# Patient Record
Sex: Female | Born: 2000 | Race: Black or African American | Hispanic: No | Smoking: Never smoker
Health system: Southern US, Community
[De-identification: ages and names within clinical notes are randomized; demographics above are authoritative.]

## PROBLEM LIST (undated history)

## (undated) HISTORY — PX: UMBILICAL HERNIA REPAIR: SHX196

---
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
## 2021-05-20 ENCOUNTER — Emergency Department: Payer: BC Managed Care – PPO

## 2021-05-20 ENCOUNTER — Emergency Department
Admission: EM | Admit: 2021-05-20 | Discharge: 2021-05-20 | Disposition: A | Discharge status: Home / Self Care | Payer: BC Managed Care – PPO | Attending: Emergency Medicine | Admitting: Emergency Medicine

## 2021-05-20 ENCOUNTER — Other Ambulatory Visit: Payer: Self-pay

## 2021-05-20 DIAGNOSIS — N390 Urinary tract infection, site not specified: Secondary | ICD-10-CM | POA: Insufficient documentation

## 2021-05-20 DIAGNOSIS — R102 Pelvic and perineal pain: Secondary | ICD-10-CM | POA: Diagnosis not present

## 2021-05-20 DIAGNOSIS — R1031 Right lower quadrant pain: Secondary | ICD-10-CM | POA: Diagnosis present

## 2021-05-20 LAB — COMPREHENSIVE METABOLIC PANEL
ALT: 13 U/L (ref 0–44)
AST: 19 U/L (ref 15–41)
Albumin: 3.8 g/dL (ref 3.5–5.0)
Alkaline Phosphatase: 38 U/L (ref 38–126)
Anion gap: 9 (ref 5–15)
BUN: 10 mg/dL (ref 6–20)
CO2: 24 mmol/L (ref 22–32)
Calcium: 8.9 mg/dL (ref 8.9–10.3)
Chloride: 105 mmol/L (ref 98–111)
Creatinine, Ser: 0.93 mg/dL (ref 0.44–1.00)
GFR, Estimated: >60 mL/min (ref >60)
Glucose, Bld: 91 mg/dL (ref 70–99)
Potassium: 3.6 mmol/L (ref 3.5–5.1)
Sodium: 138 mmol/L (ref 135–145)
Total Bilirubin: 1.2 mg/dL (ref 0.3–1.2)
Total Protein: 7.5 g/dL (ref 6.5–8.1)

## 2021-05-20 LAB — URINALYSIS, COMPLETE (UACMP) WITH MICROSCOPIC
Bilirubin Urine: NEGATIVE
Glucose, UA: NEGATIVE mg/dL
Ketones, ur: NEGATIVE mg/dL
Nitrite: NEGATIVE
Protein, ur: NEGATIVE mg/dL
Specific Gravity, Urine: 1.016 (ref 1.005–1.030)
pH: 5 (ref 5.0–8.0)

## 2021-05-20 LAB — CBC
HCT: 39.8 % (ref 36.0–46.0)
Hemoglobin: 13.2 g/dL (ref 12.0–15.0)
MCH: 28.1 pg (ref 26.0–34.0)
MCHC: 33.2 g/dL (ref 30.0–36.0)
MCV: 84.9 fL (ref 80.0–100.0)
Platelets: 226 10*3/uL (ref 150–400)
RBC: 4.69 MIL/uL (ref 3.87–5.11)
RDW: 13.2 % (ref 11.5–15.5)
WBC: 10.1 10*3/uL (ref 4.0–10.5)
nRBC: 0 % (ref 0.0–0.2)

## 2021-05-20 LAB — HCG, QUANTITATIVE, PREGNANCY: hCG, Beta Chain, Quant, S: <1 m[IU]/mL (ref <5)

## 2021-05-20 LAB — LIPASE, BLOOD: Lipase: 33 U/L (ref 11–51)

## 2021-05-20 MED ORDER — CEPHALEXIN 500 MG PO CAPS: 500.0000 mg | ORAL_CAPSULE | Freq: Two times a day (BID) | ORAL | 0 refills | Status: DC

## 2021-05-20 NOTE — ED Notes (Addendum)
Lab called to add on hcg to previous labwork

## 2021-05-20 NOTE — ED Triage Notes (Signed)
Pt here with right side and pain that does not radiate. Pt states that pain moves from right to left at times. Pt denies N/V/D. Pt states normal BM but a little gassy.

## 2021-05-20 NOTE — ED Notes (Signed)
RN assisted pt to the bathroom to provide urine sample at this time. Pt unable to void. MD made aware

## 2021-05-20 NOTE — ED Provider Notes (Signed)
[facility] Emergency Department Provider Note   ____________________________________________    I have reviewed the triage vital signs and the nursing notes.   HISTORY  Chief Complaint Abdominal Pain     HPI Debbie Tanner is a 20 y.o. female who presents with complaints of right lower quadrant abdominal pain.  Patient reports the pain started yesterday morning and was moderate to severe and constant yesterday, however today it is more intermittent and significantly improved.  She attributed to premenstrual cramping however it is atypical for her.  She has not take anything for this.  No vaginal bleeding.  She reports she is not sexually active.  No concern for STD, no vaginal discharge.  No flank pain or back pain.  No nausea or vomiting  No past medical history on file.  There are no problems to display for this patient.   No past surgical history on file.  Prior to Admission medications   Medication Sig Start Date End Date Taking? Authorizing Provider  cephALEXin (KEFLEX) 500 MG capsule Take 1 capsule (500 mg total) by mouth 2 (two) times daily. 05/20/21  Yes Jene Every, MD     Allergies Patient has no known allergies.  No family history on file.  Social History Social History   Tobacco Use   Smoking status: Never   Smokeless tobacco: Never  Substance Use Topics   Alcohol use: No    Alcohol/week: 0.0 standard drinks   Drug use: No    Review of Systems  Constitutional: No fever/chills Eyes: No visual changes.  ENT: No sore throat. Cardiovascular: Denies chest pain. Respiratory: Denies shortness of breath. Gastrointestinal: As above Genitourinary: As above Musculoskeletal: Negative for back pain. Skin: Negative for rash. Neurological: Negative for headaches or weakness   ____________________________________________   PHYSICAL EXAM:  VITAL SIGNS: ED Triage Vitals  Enc Vitals Group     BP 05/20/21 0647 137/81      Pulse Rate 05/20/21 0647 92     Resp 05/20/21 0647 16     Temp 05/20/21 0647 98.7 F (37.1 C)     Temp Source 05/20/21 0647 Oral     SpO2 05/20/21 0647 100 %     Weight 05/20/21 0645 65.3 kg (144 lb)     Height 05/20/21 0645 1.6 m (5\' 3" )     Head Circumference --      Peak Flow --      Pain Score 05/20/21 0714 8     Pain Loc --      Pain Edu? --      Excl. in GC? --     Constitutional: Alert and oriented.  Eyes: Conjunctivae are normal.   Nose: No congestion/rhinnorhea. Mouth/Throat: Mucous membranes are moist.    Cardiovascular: Normal rate, regular rhythm. Good peripheral circulation. Respiratory: Normal respiratory effort.  No retractions.  Gastrointestinal: Soft, minimal right lower quadrant tenderness, no distention.  No CVA tenderness. Genitourinary: deferred Musculoskeletal: No lower extremity tenderness nor edema.  Warm and well perfused Neurologic:  Normal speech and language. No gross focal neurologic deficits are appreciated.  Skin:  Skin is warm, dry and intact. No rash noted. Psychiatric: Mood and affect are normal. Speech and behavior are normal.  ____________________________________________   LABS (all labs ordered are listed, but only abnormal results are displayed)  Labs Reviewed  URINALYSIS, COMPLETE (UACMP) WITH MICROSCOPIC - Abnormal; Notable for the following components:      Result Value   Color, Urine YELLOW (*)  APPearance CLOUDY (*)    Hgb urine dipstick SMALL (*)    Leukocytes,Ua LARGE (*)    Bacteria, UA FEW (*)    All other components within normal limits  LIPASE, BLOOD  COMPREHENSIVE METABOLIC PANEL  CBC  HCG, QUANTITATIVE, PREGNANCY   ____________________________________________  EKG   ____________________________________________  RADIOLOGY  Ultrasound reviewed by me, no abnormality ____________________________________________   PROCEDURES  Procedure(s) performed: No  Procedures   Critical Care performed:  No ____________________________________________   INITIAL IMPRESSION / ASSESSMENT AND PLAN / ED COURSE  Pertinent labs & imaging results that were available during my care of the patient were reviewed by me and considered in my medical decision making (see chart for details).   Patient presents with right lower quadrant pain as described above, significantly improved from yesterday.  Question ovarian cyst, possibly ruptured.  Less likely torsion, normal white blood cell count, exam not consistent with appendicitis.  Doubt ureterolithiasis.  UTI is a possibility.  Pending urine, urine pregnancy, will obtain ultrasound  Pregnancy test negative, ultrasound is normal.  Urinalysis consistent with urinary tract infection, no flank pain to suggest ureterolithiasis or Pilo.  We will start the patient on antibiotics, return precautions if no improvement within 48 hours.    ____________________________________________   FINAL CLINICAL IMPRESSION(S) / ED DIAGNOSES  Final diagnoses:  Pelvic pain  Lower urinary tract infectious disease        Note:  This document was prepared using Dragon voice recognition software and may include unintentional dictation errors.    Jene Every, MD 05/20/21 1257

## 2021-06-01 ENCOUNTER — Encounter
Admission: EM | Disposition: A | Discharge status: Home / Self Care | Payer: Self-pay | Source: Home / Self Care | Attending: Emergency Medicine

## 2021-06-01 ENCOUNTER — Emergency Department: Payer: BC Managed Care – PPO

## 2021-06-01 ENCOUNTER — Observation Stay: Payer: BC Managed Care – PPO | Admitting: Certified Registered Nurse Anesthetist

## 2021-06-01 ENCOUNTER — Other Ambulatory Visit: Payer: Self-pay

## 2021-06-01 ENCOUNTER — Observation Stay
Admission: EM | Admit: 2021-06-01 | Discharge: 2021-06-02 | Disposition: A | Discharge status: Home / Self Care | Payer: BC Managed Care – PPO | Attending: Surgery | Admitting: Surgery

## 2021-06-01 ENCOUNTER — Encounter: Payer: Self-pay | Admitting: Emergency Medicine

## 2021-06-01 DIAGNOSIS — K3580 Unspecified acute appendicitis: Secondary | ICD-10-CM | POA: Diagnosis present

## 2021-06-01 DIAGNOSIS — Z20822 Contact with and (suspected) exposure to covid-19: Secondary | ICD-10-CM | POA: Insufficient documentation

## 2021-06-01 DIAGNOSIS — K353 Acute appendicitis with localized peritonitis, without perforation or gangrene: Secondary | ICD-10-CM | POA: Diagnosis not present

## 2021-06-01 DIAGNOSIS — R1030 Lower abdominal pain, unspecified: Secondary | ICD-10-CM | POA: Diagnosis present

## 2021-06-01 HISTORY — PX: LAPAROSCOPIC APPENDECTOMY: SHX408

## 2021-06-01 LAB — CBC
HCT: 38.6 % (ref 36.0–46.0)
Hemoglobin: 13.1 g/dL (ref 12.0–15.0)
MCH: 28.4 pg (ref 26.0–34.0)
MCHC: 33.9 g/dL (ref 30.0–36.0)
MCV: 83.7 fL (ref 80.0–100.0)
Platelets: 333 10*3/uL (ref 150–400)
RBC: 4.61 MIL/uL (ref 3.87–5.11)
RDW: 12.4 % (ref 11.5–15.5)
WBC: 11.7 10*3/uL — ABNORMAL HIGH (ref 4.0–10.5)
nRBC: 0 % (ref 0.0–0.2)

## 2021-06-01 LAB — POC URINE PREG, ED: Preg Test, Ur: NEGATIVE

## 2021-06-01 LAB — COMPREHENSIVE METABOLIC PANEL
ALT: 12 U/L (ref 0–44)
AST: 19 U/L (ref 15–41)
Albumin: 4.2 g/dL (ref 3.5–5.0)
Alkaline Phosphatase: 48 U/L (ref 38–126)
Anion gap: 7 (ref 5–15)
BUN: 6 mg/dL (ref 6–20)
CO2: 26 mmol/L (ref 22–32)
Calcium: 9.3 mg/dL (ref 8.9–10.3)
Chloride: 103 mmol/L (ref 98–111)
Creatinine, Ser: 0.95 mg/dL (ref 0.44–1.00)
GFR, Estimated: >60 mL/min (ref >60)
Glucose, Bld: 111 mg/dL — ABNORMAL HIGH (ref 70–99)
Potassium: 3.3 mmol/L — ABNORMAL LOW (ref 3.5–5.1)
Sodium: 136 mmol/L (ref 135–145)
Total Bilirubin: 0.8 mg/dL (ref 0.3–1.2)
Total Protein: 8.4 g/dL — ABNORMAL HIGH (ref 6.5–8.1)

## 2021-06-01 LAB — URINALYSIS, COMPLETE (UACMP) WITH MICROSCOPIC
Bacteria, UA: NONE SEEN
Bilirubin Urine: NEGATIVE
Glucose, UA: NEGATIVE mg/dL
Hgb urine dipstick: NEGATIVE
Ketones, ur: 5 mg/dL — AB
Leukocytes,Ua: NEGATIVE
Nitrite: NEGATIVE
Protein, ur: NEGATIVE mg/dL
Specific Gravity, Urine: 1.019 (ref 1.005–1.030)
pH: 6 (ref 5.0–8.0)

## 2021-06-01 LAB — RESP PANEL BY RT-PCR (FLU A&B, COVID) ARPGX2
Influenza A by PCR: NEGATIVE
Influenza B by PCR: NEGATIVE
SARS Coronavirus 2 by RT PCR: NEGATIVE

## 2021-06-01 LAB — LIPASE, BLOOD: Lipase: 29 U/L (ref 11–51)

## 2021-06-01 SURGERY — APPENDECTOMY, LAPAROSCOPIC
Anesthesia: General

## 2021-06-01 MED ORDER — LACTATED RINGERS IV SOLN: 125.0000 mL/h | INTRAVENOUS | Status: DC

## 2021-06-01 MED ORDER — PROPOFOL 10 MG/ML IV BOLUS
INTRAVENOUS | Status: DC | PRN
Start: 2021-06-01 — End: 2021-06-01
  Administered 2021-06-01: 150 mg via INTRAVENOUS

## 2021-06-01 MED ORDER — FENTANYL CITRATE (PF) 100 MCG/2ML IJ SOLN
INTRAMUSCULAR | Status: DC | PRN
  Administered 2021-06-01: 100 ug via INTRAVENOUS

## 2021-06-01 MED ORDER — LACTATED RINGERS IV SOLN: INTRAVENOUS | Status: DC

## 2021-06-01 MED ORDER — ONDANSETRON HCL 4 MG/2ML IJ SOLN
4.0000 mg | Freq: Four times a day (QID) | INTRAMUSCULAR | Status: DC | PRN
  Administered 2021-06-01: 4 mg via INTRAVENOUS

## 2021-06-01 MED ORDER — PROPOFOL 10 MG/ML IV BOLUS
INTRAVENOUS | Status: AC
  Filled 2021-06-01: qty 20

## 2021-06-01 MED ORDER — PIPERACILLIN-TAZOBACTAM 3.375 G IVPB
3.3750 g | Freq: Three times a day (TID) | INTRAVENOUS | Status: DC
  Administered 2021-06-01 – 2021-06-02 (×5): 3.375 g via INTRAVENOUS
  Filled 2021-06-01 (×7): qty 50

## 2021-06-01 MED ORDER — OXYCODONE HCL 5 MG PO TABS: 5.0000 mg | ORAL_TABLET | ORAL | Status: DC | PRN

## 2021-06-01 MED ORDER — 0.9 % SODIUM CHLORIDE (POUR BTL) OPTIME
TOPICAL | Status: DC | PRN
  Administered 2021-06-01: 500 mL

## 2021-06-01 MED ORDER — ROCURONIUM BROMIDE 100 MG/10ML IV SOLN
INTRAVENOUS | Status: DC | PRN
  Administered 2021-06-01: 500 mg via INTRAVENOUS

## 2021-06-01 MED ORDER — LACTATED RINGERS IV BOLUS
1000.0000 mL | Freq: Once | INTRAVENOUS | Status: AC
  Administered 2021-06-01: 1000 mL via INTRAVENOUS

## 2021-06-01 MED ORDER — OXYCODONE HCL 5 MG PO TABS: 5.0000 mg | ORAL_TABLET | Freq: Once | ORAL | Status: DC | PRN

## 2021-06-01 MED ORDER — PANTOPRAZOLE SODIUM 40 MG IV SOLR
40.0000 mg | Freq: Every day | INTRAVENOUS | Status: DC
  Administered 2021-06-01: 40 mg via INTRAVENOUS
  Filled 2021-06-01 (×2): qty 40

## 2021-06-01 MED ORDER — ONDANSETRON 4 MG PO TBDP: 4.0000 mg | ORAL_TABLET | Freq: Four times a day (QID) | ORAL | Status: DC | PRN

## 2021-06-01 MED ORDER — MORPHINE SULFATE (PF) 4 MG/ML IV SOLN
4.0000 mg | Freq: Once | INTRAVENOUS | Status: AC
  Administered 2021-06-01: 4 mg via INTRAVENOUS
  Filled 2021-06-01: qty 1

## 2021-06-01 MED ORDER — NEOSTIGMINE METHYLSULFATE 10 MG/10ML IV SOLN
INTRAVENOUS | Status: AC
  Filled 2021-06-01: qty 1

## 2021-06-01 MED ORDER — ACETAMINOPHEN 10 MG/ML IV SOLN: 1000.0000 mg | Freq: Once | INTRAVENOUS | Status: DC | PRN

## 2021-06-01 MED ORDER — FENTANYL CITRATE (PF) 100 MCG/2ML IJ SOLN
INTRAMUSCULAR | Status: AC
  Filled 2021-06-01: qty 2

## 2021-06-01 MED ORDER — PIPERACILLIN-TAZOBACTAM 3.375 G IVPB
INTRAVENOUS | Status: AC
  Filled 2021-06-01: qty 50

## 2021-06-01 MED ORDER — ACETAMINOPHEN 10 MG/ML IV SOLN
INTRAVENOUS | Status: DC | PRN
  Administered 2021-06-01: 1000 mg via INTRAVENOUS

## 2021-06-01 MED ORDER — MIDAZOLAM HCL 2 MG/2ML IJ SOLN
INTRAMUSCULAR | Status: AC
  Filled 2021-06-01: qty 2

## 2021-06-01 MED ORDER — MIDAZOLAM HCL 2 MG/2ML IJ SOLN
INTRAMUSCULAR | Status: DC | PRN
  Administered 2021-06-01: 2 mg via INTRAVENOUS

## 2021-06-01 MED ORDER — FENTANYL CITRATE (PF) 100 MCG/2ML IJ SOLN
25.0000 ug | INTRAMUSCULAR | Status: DC | PRN
  Administered 2021-06-01: 50 ug via INTRAVENOUS

## 2021-06-01 MED ORDER — GLYCOPYRROLATE 0.2 MG/ML IJ SOLN
INTRAMUSCULAR | Status: DC | PRN
  Administered 2021-06-01: .4 mg via INTRAVENOUS

## 2021-06-01 MED ORDER — LIDOCAINE HCL (CARDIAC) PF 100 MG/5ML IV SOSY
PREFILLED_SYRINGE | INTRAVENOUS | Status: DC | PRN
  Administered 2021-06-01: 60 mg via INTRAVENOUS

## 2021-06-01 MED ORDER — IOHEXOL 350 MG/ML SOLN
75.0000 mL | Freq: Once | INTRAVENOUS | Status: AC | PRN
  Administered 2021-06-01: 75 mL via INTRAVENOUS

## 2021-06-01 MED ORDER — ACETAMINOPHEN 500 MG PO TABS: 1000.0000 mg | ORAL_TABLET | Freq: Four times a day (QID) | ORAL | Status: DC | PRN

## 2021-06-01 MED ORDER — DEXAMETHASONE SODIUM PHOSPHATE 10 MG/ML IJ SOLN
INTRAMUSCULAR | Status: DC | PRN
  Administered 2021-06-01: 10 mg via INTRAVENOUS

## 2021-06-01 MED ORDER — PHENYLEPHRINE HCL (PRESSORS) 10 MG/ML IV SOLN
INTRAVENOUS | Status: AC
  Filled 2021-06-01: qty 1

## 2021-06-01 MED ORDER — ACETAMINOPHEN 10 MG/ML IV SOLN
INTRAVENOUS | Status: AC
  Filled 2021-06-01: qty 100

## 2021-06-01 MED ORDER — KETOROLAC TROMETHAMINE 30 MG/ML IJ SOLN
INTRAMUSCULAR | Status: DC | PRN
  Administered 2021-06-01: 15 mg via INTRAVENOUS

## 2021-06-01 MED ORDER — HYDROMORPHONE HCL 1 MG/ML IJ SOLN: 0.5000 mg | INTRAMUSCULAR | Status: DC | PRN

## 2021-06-01 MED ORDER — KETOROLAC TROMETHAMINE 30 MG/ML IJ SOLN
30.0000 mg | Freq: Four times a day (QID) | INTRAMUSCULAR | Status: DC
  Administered 2021-06-01 – 2021-06-02 (×3): 30 mg via INTRAVENOUS
  Filled 2021-06-01 (×3): qty 1

## 2021-06-01 MED ORDER — ONDANSETRON HCL 4 MG/2ML IJ SOLN: 4.0000 mg | Freq: Once | INTRAMUSCULAR | Status: DC | PRN

## 2021-06-01 MED ORDER — BUPIVACAINE-EPINEPHRINE (PF) 0.5% -1:200000 IJ SOLN
INTRAMUSCULAR | Status: DC | PRN
  Administered 2021-06-01: 30 mL via PERINEURAL

## 2021-06-01 MED ORDER — ONDANSETRON HCL 4 MG/2ML IJ SOLN
4.0000 mg | INTRAMUSCULAR | Status: AC
  Administered 2021-06-01: 4 mg via INTRAVENOUS
  Filled 2021-06-01: qty 2

## 2021-06-01 MED ORDER — POLYETHYLENE GLYCOL 3350 17 G PO PACK
17.0000 g | PACK | Freq: Every day | ORAL | Status: DC | PRN
  Filled 2021-06-01: qty 1

## 2021-06-01 MED ORDER — OXYCODONE HCL 5 MG/5ML PO SOLN: 5.0000 mg | Freq: Once | ORAL | Status: DC | PRN

## 2021-06-01 MED ORDER — NEOSTIGMINE METHYLSULFATE 10 MG/10ML IV SOLN
INTRAVENOUS | Status: DC | PRN
  Administered 2021-06-01: 4 mg via INTRAVENOUS

## 2021-06-01 SURGICAL SUPPLY — 44 items
CANISTER SUCT 1200ML W/VALVE (MISCELLANEOUS) ×2 IMPLANT
CHLORAPREP W/TINT 26 (MISCELLANEOUS) ×2 IMPLANT
CUTTER FLEX LINEAR 45M (STAPLE) IMPLANT
DERMABOND ADVANCED (GAUZE/BANDAGES/DRESSINGS) ×1
DERMABOND ADVANCED .7 DNX12 (GAUZE/BANDAGES/DRESSINGS) ×1 IMPLANT
ELECT CAUTERY BLADE TIP 2.5 (TIP) ×2
ELECT REM PT RETURN 9FT ADLT (ELECTROSURGICAL) ×2
ELECTRODE CAUTERY BLDE TIP 2.5 (TIP) ×1 IMPLANT
ELECTRODE REM PT RTRN 9FT ADLT (ELECTROSURGICAL) ×1 IMPLANT
GAUZE 4X4 16PLY ~~LOC~~+RFID DBL (SPONGE) ×2 IMPLANT
GLOVE SURG SYN 7.0 (GLOVE) ×2 IMPLANT
GLOVE SURG SYN 7.5  E (GLOVE) ×1
GLOVE SURG SYN 7.5 E (GLOVE) ×1 IMPLANT
GOWN STRL REUS W/ TWL LRG LVL3 (GOWN DISPOSABLE) ×2 IMPLANT
GOWN STRL REUS W/TWL LRG LVL3 (GOWN DISPOSABLE) ×2
IRRIGATION STRYKERFLOW (MISCELLANEOUS) IMPLANT
IRRIGATOR STRYKERFLOW (MISCELLANEOUS)
IV NS 1000ML (IV SOLUTION)
IV NS 1000ML BAXH (IV SOLUTION) IMPLANT
KIT TURNOVER KIT A (KITS) ×2 IMPLANT
LABEL OR SOLS (LABEL) ×2 IMPLANT
LIGASURE LAP MARYLAND 5MM 37CM (ELECTROSURGICAL) ×2 IMPLANT
MANIFOLD NEPTUNE II (INSTRUMENTS) ×2 IMPLANT
NEEDLE HYPO 22GX1.5 SAFETY (NEEDLE) ×2 IMPLANT
NS IRRIG 500ML POUR BTL (IV SOLUTION) ×2 IMPLANT
PACK LAP CHOLECYSTECTOMY (MISCELLANEOUS) ×2 IMPLANT
PENCIL ELECTRO HAND CTR (MISCELLANEOUS) ×2 IMPLANT
POUCH SPECIMEN RETRIEVAL 10MM (ENDOMECHANICALS) ×2 IMPLANT
RELOAD 45 VASCULAR/THIN (ENDOMECHANICALS) IMPLANT
RELOAD STAPLE TA45 3.5 REG BLU (ENDOMECHANICALS) ×2 IMPLANT
SCISSORS METZENBAUM CVD 33 (INSTRUMENTS) IMPLANT
SLEEVE ADV FIXATION 5X100MM (TROCAR) ×2 IMPLANT
SUT MNCRL 4-0 (SUTURE) ×1
SUT MNCRL 4-0 27XMFL (SUTURE) ×1
SUT VIC AB 3-0 SH 27 (SUTURE) ×1
SUT VIC AB 3-0 SH 27X BRD (SUTURE) ×1 IMPLANT
SUT VICRYL 0 AB UR-6 (SUTURE) ×2 IMPLANT
SUTURE MNCRL 4-0 27XMF (SUTURE) ×1 IMPLANT
SYS KII FIOS ACCESS ABD 5X100 (TROCAR) ×2
SYSTEM KII FIOS ACES ABD 5X100 (TROCAR) ×1 IMPLANT
TRAY FOLEY MTR SLVR 16FR STAT (SET/KITS/TRAYS/PACK) ×2 IMPLANT
TROCAR BALLN GELPORT 12X130M (ENDOMECHANICALS) ×2 IMPLANT
TUBING EVAC SMOKE HEATED PNEUM (TUBING) ×2 IMPLANT
WATER STERILE IRR 500ML POUR (IV SOLUTION) ×2 IMPLANT

## 2021-06-01 NOTE — ED Notes (Signed)
This RN answers call light, Pt states she her pain is increasing but coming "in waves". Pt states last increase was to 10/10 - Currently 7/10. MD aware.

## 2021-06-01 NOTE — Anesthesia Preprocedure Evaluation (Signed)
Anesthesia Evaluation  Patient identified by MRN, date of birth, ID band Patient awake    Reviewed: Allergy & Precautions, NPO status , Patient's Chart, lab work & pertinent test results  History of Anesthesia Complications Negative for: history of anesthetic complications  Airway Mallampati: II  TM Distance: >3 FB Neck ROM: Full    Dental no notable dental hx. (+) Teeth Intact   Pulmonary neg pulmonary ROS, neg sleep apnea, neg COPD, Patient abstained from smoking.Not current smoker,    Pulmonary exam normal breath sounds clear to auscultation       Cardiovascular Exercise Tolerance: Good METS(-) hypertension(-) CAD and (-) Past MI negative cardio ROS  (-) dysrhythmias  Rhythm:Regular Rate:Normal - Systolic murmurs    Neuro/Psych negative neurological ROS  negative psych ROS   GI/Hepatic neg GERD  ,(+)     (-) substance abuse  ,   Endo/Other  neg diabetes  Renal/GU negative Renal ROS     Musculoskeletal   Abdominal   Peds  Hematology   Anesthesia Other Findings History reviewed. No pertinent past medical history.  Reproductive/Obstetrics                             Anesthesia Physical Anesthesia Plan  ASA: 1  Anesthesia Plan: General   Post-op Pain Management:    Induction: Intravenous  PONV Risk Score and Plan: 4 or greater and Ondansetron, Dexamethasone and Midazolam  Airway Management Planned: Oral ETT  Additional Equipment: None  Intra-op Plan:   Post-operative Plan: Extubation in OR  Informed Consent: I have reviewed the patients History and Physical, chart, labs and discussed the procedure including the risks, benefits and alternatives for the proposed anesthesia with the patient or authorized representative who has indicated his/her understanding and acceptance.     Dental advisory given  Plan Discussed with: CRNA and Surgeon  Anesthesia Plan Comments:  (Discussed risks of anesthesia with patient, including PONV, sore throat, lip/dental damage. Rare risks discussed as well, such as cardiorespiratory and neurological sequelae, and allergic reactions. Patient understands.)        Anesthesia Quick Evaluation  

## 2021-06-01 NOTE — Anesthesia Procedure Notes (Addendum)
Procedure Name: Intubation Date/Time: 06/01/2021 10:47 AM Performed by: Arita Miss, MD Pre-anesthesia Checklist: Patient identified, Patient being monitored, Timeout performed, Emergency Drugs available and Suction available Patient Re-evaluated:Patient Re-evaluated prior to induction Oxygen Delivery Method: Circle system utilized Preoxygenation: Pre-oxygenation with 100% oxygen Induction Type: IV induction and Rapid sequence Laryngoscope Size: Mac and 3 Grade View: Grade I Tube type: Oral Tube size: 6.5 mm Number of attempts: 1 Airway Equipment and Method: Stylet Placement Confirmation: ETT inserted through vocal cords under direct vision, positive ETCO2 and breath sounds checked- equal and bilateral Secured at: 22 cm Tube secured with: Tape Dental Injury: Teeth and Oropharynx as per pre-operative assessment  Comments: Mask ventilation not attempted. Unremarkable RSI

## 2021-06-01 NOTE — ED Triage Notes (Signed)
Pt reports RUQ pain since today reports pain at present radiated to RLQ. Pt reports nausea denies any episodes of emesis. Pt reports last BM Thursday. Pt talks in complete sentences no distress noted

## 2021-06-01 NOTE — ED Provider Notes (Signed)
[facility] Emergency Department Provider Note  ____________________________________________   Event Date/Time   First MD Initiated Contact with Patient 06/01/21 [phone]     (approximate)  I have reviewed the triage vital signs and the nursing notes.   HISTORY  Chief Complaint Abdominal Pain    HPI Debbie Tanner is a 20 y.o. female with no chronic medical issues and who was recently treated for UTI and some associated lower abdominal pain.  She presents for abdominal pain that started as right upper quadrant abdominal pain yesterday that has now turned into right lower quadrant abdominal pain.  Seems to come in waves and feels like a sharp stabbing pain.  She has had nausea but no vomiting.  She reports decreased appetite.  The pain is very mild at this point but sometimes it becomes severe.  Nothing in particular seems to make it better and moving around makes it worse.  She denies fever, sore throat, chest pain, shortness of breath.  She finished her menstrual cycle last week.  She has no vaginal complaints or concerns at this time.     History reviewed. No pertinent past medical history.  Patient Active Problem List   Diagnosis Date Noted   Acute appendicitis 06/01/2021    Past Surgical History:  Procedure Laterality Date   HERNIA REPAIR      Prior to Admission medications   Medication Sig Start Date End Date Taking? Authorizing Provider  cephALEXin (KEFLEX) 500 MG capsule Take 1 capsule (500 mg total) by mouth 2 (two) times daily. Patient not taking: Reported on 06/01/2021 05/20/21   Jene Every, MD    Allergies Patient has no known allergies.  No family history on file.  Social History Social History   Tobacco Use   Smoking status: Never   Smokeless tobacco: Never  Substance Use Topics   Alcohol use: No    Alcohol/week: 0.0 standard drinks   Drug use: No    Review of Systems Constitutional: No fever/chills Eyes: No visual  changes. ENT: No sore throat. Cardiovascular: Denies chest pain. Respiratory: Denies shortness of breath. Gastrointestinal: Right upper quadrant pain that became sharp stabbing right lower quadrant pain associated with nausea, no vomiting, positive for decreased appetite. Genitourinary: Negative for dysuria. Musculoskeletal: Negative for neck pain.  Negative for back pain. Integumentary: Negative for rash. Neurological: Negative for headaches, focal weakness or numbness.   ____________________________________________   PHYSICAL EXAM:  VITAL SIGNS: ED Triage Vitals  Enc Vitals Group     BP 06/01/21 0131 (!) 153/93     Pulse Rate 06/01/21 0131 (!) 101     Resp 06/01/21 0131 20     Temp 06/01/21 0131 98.8 F (37.1 C)     Temp Source 06/01/21 0131 Oral     SpO2 06/01/21 0131 100 %     Weight 06/01/21 0132 65.3 kg (144 lb)     Height 06/01/21 0132 1.6 m (5\' 3" )     Head Circumference --      Peak Flow --      Pain Score 06/01/21 0132 5     Pain Loc --      Pain Edu? --      Excl. in GC? --     Constitutional: Alert and oriented.  Eyes: Conjunctivae are normal.  Head: Atraumatic. Nose: No congestion/rhinnorhea. Mouth/Throat: Patient is wearing a mask. Neck: No stridor.  No meningeal signs.   Cardiovascular: Normal rate, regular rhythm. Good peripheral circulation. Respiratory: Normal respiratory effort.  No retractions. Gastrointestinal: Soft and nondistended.  No rebound or guarding anywhere in the abdomen except in the right lower quadrant where she has a localized peritonitis. Genitourinary: Deferred Musculoskeletal: No lower extremity tenderness nor edema. No gross deformities of extremities. Neurologic:  Normal speech and language. No gross focal neurologic deficits are appreciated.  Skin:  Skin is warm, dry and intact. Psychiatric: Mood and affect are normal. Speech and behavior are normal.  ____________________________________________   LABS (all labs ordered  are listed, but only abnormal results are displayed)  Labs Reviewed  COMPREHENSIVE METABOLIC PANEL - Abnormal; Notable for the following components:      Result Value   Potassium 3.3 (*)    Glucose, Bld 111 (*)    Total Protein 8.4 (*)    All other components within normal limits  CBC - Abnormal; Notable for the following components:   WBC 11.7 (*)    All other components within normal limits  URINALYSIS, COMPLETE (UACMP) WITH MICROSCOPIC - Abnormal; Notable for the following components:   Color, Urine YELLOW (*)    APPearance CLEAR (*)    Ketones, ur 5 (*)    All other components within normal limits  RESP PANEL BY RT-PCR (FLU A&B, COVID) ARPGX2  LIPASE, BLOOD  HIV ANTIBODY (ROUTINE TESTING W REFLEX)  POC URINE PREG, ED   ____________________________________________   RADIOLOGY I, Loleta Rose, personally viewed and evaluated these images (plain radiographs) as part of my medical decision making, as well as reviewing the written report by the radiologist.  ED MD interpretation: Probable early appendicitis  Official radiology report(s): CT ABDOMEN PELVIS W CONTRAST  Result Date: 06/01/2021 CLINICAL DATA:  Right lower quadrant pain, rule out appendicitis. EXAM: CT ABDOMEN AND PELVIS WITH CONTRAST TECHNIQUE: Multidetector CT imaging of the abdomen and pelvis was performed using the standard protocol following bolus administration of intravenous contrast. CONTRAST:  30mL OMNIPAQUE IOHEXOL 350 MG/ML SOLN COMPARISON:  None. FINDINGS: Lower chest: No acute abnormality. Hepatobiliary: No focal liver abnormality is seen. No gallstones, gallbladder wall thickening, or biliary dilatation. Pancreas: Unremarkable. No pancreatic ductal dilatation or surrounding inflammatory changes. Spleen: Normal in size without focal abnormality. Adrenals/Urinary Tract: Adrenal glands are unremarkable. Kidneys are normal, without renal calculi, focal lesion, or hydronephrosis. Bladder is unremarkable.  Stomach/Bowel: Stomach appears normal. The appendix is visualized and measures up to 7 mm in diameter. There is gas noted throughout the lumen of the appendix. There is mild periappendiceal soft tissue haziness and equivocal wall thickening, image 42/2. No signs of bowel wall thickening, inflammation or distension. Vascular/Lymphatic: No significant vascular findings are present. No enlarged abdominal or pelvic lymph nodes. Reproductive: Uterus and bilateral adnexa are unremarkable. Other: No free fluid or fluid collections. Musculoskeletal: No acute or significant osseous findings. IMPRESSION: 1. The appendix is visualized and measures up to 7 mm in diameter. There is mild periappendiceal soft tissue haziness and equivocal wall thickening. Cannot rule out early appendicitis. No complicating features identified. 2. No other acute findings identified within the abdomen or pelvis. Electronically Signed   By: Signa Kell M.D.   On: 06/01/2021 05:13    ____________________________________________   PROCEDURES   Procedure(s) performed (including Critical Care):  Procedures   ____________________________________________   INITIAL IMPRESSION / MDM / ASSESSMENT AND PLAN / ED COURSE  As part of my medical decision making, I reviewed the following data within the electronic MEDICAL RECORD NUMBER Nursing notes reviewed and incorporated, Labs reviewed , Old chart reviewed, Discussed with admitting physician , A consult  was requested and obtained from this/these consultant(s) Surgery, and Notes from prior ED visits   Differential diagnosis includes, but is not limited to, appendicitis, ovarian cyst, ovarian torsion, pyelonephritis, STD/PID.  Patient had recent UTI and completed antibiotics but she says this feels completely different.  Given the nature of the pain and the fact that she had a normal ultrasound very recently that showed no large ovarian cyst, I strongly doubt ovarian torsion at this time.  Her  vital signs are stable and within normal limits.  She has a mild leukocytosis and a normal metabolic panel.  Given the constellation of symptoms and the local peritonitis, I will proceed with CT scan with IV contrast.  Patient is not requiring analgesia at this time.  Patient understands and agrees with the plan.  I am keeping her n.p.o.       Clinical Course as of 06/01/21 5038  Sat Jun 01, 2021  0535 CT scan suggests early appendicitis.  Patient is having quite a bit more pain so ordered morphine 4 mg IV and Zofran 4 mg IV as well as a liter of lactated Ringer's.  I will discussed the case with surgery [CF]  0550 Discussed case by phone with Dr. Aleen Campi.  He is reviewing the imaging and will come see the patient in person. [CF]  [id number] Updated patient, she agrees with the plan. [CF]  0603 Discussed case with Dr. Aleen Campi again by phone.  He said he will admit the patient and put in orders and see her within a few hours.  I think that is appropriate.  COVID test is pending. [CF]    Clinical Course User Index [CF] Loleta Rose, MD     ____________________________________________  FINAL CLINICAL IMPRESSION(S) / ED DIAGNOSES  Final diagnoses:  Acute appendicitis with localized peritonitis, without perforation, abscess, or gangrene     MEDICATIONS GIVEN DURING THIS VISIT:  Medications  lactated ringers infusion (has no administration in time range)  HYDROmorphone (DILAUDID) injection 0.5 mg (has no administration in time range)  polyethylene glycol (MIRALAX / GLYCOLAX) packet 17 g (has no administration in time range)  ondansetron (ZOFRAN-ODT) disintegrating tablet 4 mg (has no administration in time range)    Or  ondansetron (ZOFRAN) injection 4 mg (has no administration in time range)  pantoprazole (PROTONIX) injection 40 mg (has no administration in time range)  piperacillin-tazobactam (ZOSYN) IVPB 3.375 g (has no administration in time range)  iohexol (OMNIPAQUE) 350 MG/ML  injection 75 mL (75 mLs Intravenous Contrast Given 06/01/21 0428)  morphine 4 MG/ML injection 4 mg (4 mg Intravenous Given 06/01/21 0549)  ondansetron (ZOFRAN) injection 4 mg (4 mg Intravenous Given 06/01/21 0549)  lactated ringers bolus 1,000 mL (1,000 mLs Intravenous New Bag/Given 06/01/21 0549)     ED Discharge Orders     None        Note:  This document was prepared using Dragon voice recognition software and may include unintentional dictation errors.   Loleta Rose, MD 06/01/21 [phone]

## 2021-06-01 NOTE — Transfer of Care (Signed)
Immediate Anesthesia Transfer of Care Note  Patient: Debbie Tanner  Procedure(s) Performed: APPENDECTOMY LAPAROSCOPIC  Patient Location: PACU  Anesthesia Type:General  Level of Consciousness: awake  Airway & Oxygen Therapy: Patient Spontanous Breathing  Post-op Assessment: Report given to RN  Post vital signs: stable  Last Vitals:  Vitals Value Taken Time  BP    Temp    Pulse 67 06/01/21 1225  Resp 21 06/01/21 1225  SpO2 96 % 06/01/21 1225  Vitals shown include unvalidated device data.  Last Pain:  Vitals:   06/01/21 0612  TempSrc:   PainSc: 8          Complications: No notable events documented.

## 2021-06-01 NOTE — ED Notes (Signed)
Pt states pain is 9/10 - noted to be in L side lying position in no apparent acute distress at this time. Pt on cell phone with call light in reach.

## 2021-06-01 NOTE — Op Note (Signed)
  Procedure Date:  06/01/2021  Pre-operative Diagnosis:  Acute appendicitis  Post-operative Diagnosis: Acute appendicitis  Procedure:  Laparoscopic appendectomy  Surgeon:  Howie Ill, MD  Anesthesia:  General endotracheal  Estimated Blood Loss:  5 ml  Specimens:  appendix  Complications:  None  Indications for Procedure:  This is a 20 y.o. female who presents with abdominal pain and workup revealing acute appendicitis.  The options of surgery versus observation were reviewed with the patient and/or family. The risks of bleeding, infection, recurrence of symptoms, negative laparoscopy, potential for an open procedure, bowel injury, abscess or infection, were all discussed with the patient and she was willing to proceed.  Description of Procedure: The patient was correctly identified in the preoperative area and brought into the operating room.  The patient was placed supine with VTE prophylaxis in place.  Appropriate time-outs were performed.  Anesthesia was induced and the patient was intubated.  Foley catheter was placed.  Appropriate antibiotics were infused.  The abdomen was prepped and draped in a sterile fashion. An infraumbilical incision was made. A cutdown technique was used to enter the abdominal cavity without injury, and a Hasson trocar was inserted.  Pneumoperitoneum was obtained with appropriate opening pressures.  Two 5-mm ports were placed in the suprapubic and left lateral positions under direct visualization.  The right lower quadrant was inspected and the appendix was identified and found to be acutely inflamed distally.  It was in retrocecal position.  The appendix was carefully dissected.  The mesoappendix was divided using the LigaSure.  The base of the appendix was dissected out and divided with a standard load Endo GIA.  The appendix was placed in an Endocatch bag.  The right lower quadrant was then inspected again revealing an intact staple line, no bleeding,  and no bowel injury.  The area was thoroughly irrigated including the pelvis which had some ascitic fluid.  The 5 mm ports were removed under direct visualization and the Hasson trocar was removed.  The Endocatch bag was brought out through the umbilical incision.  The fascial opening was closed using 0 vicryl suture.  Local anesthetic was infused in all incisions and the incisions were closed with 4-0 Monocryl.  The wounds were cleaned and sealed with DermaBond.  Foley catheter was removed and the patient was emerged from anesthesia and extubated and brought to the recovery room for further management.  The patient tolerated the procedure well and all counts were correct at the end of the case.   Howie Ill, MD

## 2021-06-01 NOTE — H&P (Signed)
Date of Admission:  06/01/2021  Reason for Admission:  Acute appendicitis  History of Present Illness: Debbie Tanner is a 20 y.o. female presenting with a 1-day history of right sided abdominal pain.  She reports that the pain started last night and was sudden.  Associated with nausea but no emesis.  Reports starting chills in the ED but not at home.  Denies fevers, chest pain, shortness of breath.  She was in ED two weeks ago for right sided pain and was diagnosed with UTI.  However, she reports that this pain is not the same as before.  Started towards upper abdomen and now is more on right lower area.  Workup in ED showed WBC of 11.7 and CT scan with findings concerning for early appendicitis.  I have personally viewed the images and agree with the findings.  The appendix is retrocecal.  Past Medical History: History reviewed. No pertinent past medical history.   Past Surgical History: Past Surgical History:  Procedure Laterality Date   UMBILICAL HERNIA REPAIR     Age 58 yrs    Home Medications: Prior to Admission medications   Medication Sig Start Date End Date Taking? Authorizing Provider  cephALEXin (KEFLEX) 500 MG capsule Take 1 capsule (500 mg total) by mouth 2 (two) times daily. Patient not taking: Reported on 06/01/2021 05/20/21   Jene Every, MD    Allergies: No Known Allergies  Social History:  reports that she has never smoked. She has never used smokeless tobacco. She reports that she does not drink alcohol and does not use drugs.   Family History: Reports her cousin just recently had appendicitis as well.  Review of Systems: Review of Systems  Constitutional:  Negative for chills and fever.  HENT:  Negative for hearing loss.   Respiratory:  Negative for shortness of breath.   Cardiovascular:  Negative for chest pain.  Gastrointestinal:  Positive for abdominal pain and nausea. Negative for constipation, diarrhea and vomiting.  Genitourinary:  Negative for  dysuria.  Musculoskeletal:  Negative for myalgias.  Skin:  Negative for rash.  Neurological:  Negative for dizziness.  Psychiatric/Behavioral:  Negative for depression.    Physical Exam BP 126/89   Pulse 76   Temp 98.8 F (37.1 C) (Oral)   Resp 16   Ht 5\' 3"  (1.6 m)   Wt 65.3 kg   LMP 05/23/2021 (Approximate)   SpO2 97%   BMI 25.51 kg/m  CONSTITUTIONAL: No acute distress, well nourished. HEENT:  Normocephalic, atraumatic, extraocular motion intact. NECK: Trachea is midline, and there is no jugular venous distension.  RESPIRATORY:  Normal respiratory effort without pathologic use of accessory muscles. CARDIOVASCULAR: Regular rhythm and rate. GI: The abdomen is soft, non-distended, with tenderness in the right lower quadrant and right lateral side, consistent with retrocecal appendix location.  MUSCULOSKELETAL:  Normal muscle strength and tone in all four extremities.  No peripheral edema or cyanosis. SKIN: Skin turgor is normal. There are no pathologic skin lesions.  NEUROLOGIC:  Motor and sensation is grossly normal.  Cranial nerves are grossly intact. PSYCH:  Alert and oriented to person, place and time. Affect is normal.  Laboratory Analysis: Results for orders placed or performed during the hospital encounter of 06/01/21 (from the past 24 hour(s))  Lipase, blood     Status: None   Collection Time: 06/01/21  1:36 AM  Result Value Ref Range   Lipase 29 11 - 51 U/L  Comprehensive metabolic panel     Status: Abnormal  Collection Time: 06/01/21  1:36 AM  Result Value Ref Range   Sodium 136 135 - 145 mmol/L   Potassium 3.3 (L) 3.5 - 5.1 mmol/L   Chloride 103 98 - 111 mmol/L   CO2 26 22 - 32 mmol/L   Glucose, Bld 111 (H) 70 - 99 mg/dL   BUN 6 6 - 20 mg/dL   Creatinine, Ser 6.38 0.44 - 1.00 mg/dL   Calcium 9.3 8.9 - 75.6 mg/dL   Total Protein 8.4 (H) 6.5 - 8.1 g/dL   Albumin 4.2 3.5 - 5.0 g/dL   AST 19 15 - 41 U/L   ALT 12 0 - 44 U/L   Alkaline Phosphatase 48 38 - 126  U/L   Total Bilirubin 0.8 0.3 - 1.2 mg/dL   GFR, Estimated >43 >32 mL/min   Anion gap 7 5 - 15  CBC     Status: Abnormal   Collection Time: 06/01/21  1:36 AM  Result Value Ref Range   WBC 11.7 (H) 4.0 - 10.5 K/uL   RBC 4.61 3.87 - 5.11 MIL/uL   Hemoglobin 13.1 12.0 - 15.0 g/dL   HCT 95.1 88.4 - 16.6 %   MCV 83.7 80.0 - 100.0 fL   MCH 28.4 26.0 - 34.0 pg   MCHC 33.9 30.0 - 36.0 g/dL   RDW 06.3 01.6 - 01.0 %   Platelets 333 150 - 400 K/uL   nRBC 0.0 0.0 - 0.2 %  Urinalysis, Complete w Microscopic     Status: Abnormal   Collection Time: 06/01/21  1:36 AM  Result Value Ref Range   Color, Urine YELLOW (A) YELLOW   APPearance CLEAR (A) CLEAR   Specific Gravity, Urine 1.019 1.005 - 1.030   pH 6.0 5.0 - 8.0   Glucose, UA NEGATIVE NEGATIVE mg/dL   Hgb urine dipstick NEGATIVE NEGATIVE   Bilirubin Urine NEGATIVE NEGATIVE   Ketones, ur 5 (A) NEGATIVE mg/dL   Protein, ur NEGATIVE NEGATIVE mg/dL   Nitrite NEGATIVE NEGATIVE   Leukocytes,Ua NEGATIVE NEGATIVE   RBC / HPF 0-5 0 - 5 RBC/hpf   WBC, UA 6-10 0 - 5 WBC/hpf   Bacteria, UA NONE SEEN NONE SEEN   Squamous Epithelial / LPF 0-5 0 - 5   Mucus PRESENT   POC urine preg, ED     Status: None   Collection Time: 06/01/21  3:45 AM  Result Value Ref Range   Preg Test, Ur Negative Negative  Resp Panel by RT-PCR (Flu A&B, Covid) Nasopharyngeal Swab     Status: None   Collection Time: 06/01/21  6:11 AM   Specimen: Nasopharyngeal Swab; Nasopharyngeal(NP) swabs in vial transport medium  Result Value Ref Range   SARS Coronavirus 2 by RT PCR NEGATIVE NEGATIVE   Influenza A by PCR NEGATIVE NEGATIVE   Influenza B by PCR NEGATIVE NEGATIVE    Imaging: CT ABDOMEN PELVIS W CONTRAST  Result Date: 06/01/2021 CLINICAL DATA:  Right lower quadrant pain, rule out appendicitis. EXAM: CT ABDOMEN AND PELVIS WITH CONTRAST TECHNIQUE: Multidetector CT imaging of the abdomen and pelvis was performed using the standard protocol following bolus administration of  intravenous contrast. CONTRAST:  66mL OMNIPAQUE IOHEXOL 350 MG/ML SOLN COMPARISON:  None. FINDINGS: Lower chest: No acute abnormality. Hepatobiliary: No focal liver abnormality is seen. No gallstones, gallbladder wall thickening, or biliary dilatation. Pancreas: Unremarkable. No pancreatic ductal dilatation or surrounding inflammatory changes. Spleen: Normal in size without focal abnormality. Adrenals/Urinary Tract: Adrenal glands are unremarkable. Kidneys are normal, without renal calculi, focal  lesion, or hydronephrosis. Bladder is unremarkable. Stomach/Bowel: Stomach appears normal. The appendix is visualized and measures up to 7 mm in diameter. There is gas noted throughout the lumen of the appendix. There is mild periappendiceal soft tissue haziness and equivocal wall thickening, image 42/2. No signs of bowel wall thickening, inflammation or distension. Vascular/Lymphatic: No significant vascular findings are present. No enlarged abdominal or pelvic lymph nodes. Reproductive: Uterus and bilateral adnexa are unremarkable. Other: No free fluid or fluid collections. Musculoskeletal: No acute or significant osseous findings. IMPRESSION: 1. The appendix is visualized and measures up to 7 mm in diameter. There is mild periappendiceal soft tissue haziness and equivocal wall thickening. Cannot rule out early appendicitis. No complicating features identified. 2. No other acute findings identified within the abdomen or pelvis. Electronically Signed   By: Signa Kell M.D.   On: 06/01/2021 05:13    Assessment and Plan: This is a 20 y.o. female with early acute appendicitis  --Discussed with the patient the labs and imaging results that are showing early acute appendicitis.  Discussed with her the potential options for conservative vs surgical management in detail.  If conservative measures, could not guarantee that antibiotics alone would help and she would not need surgery, and the potential for appendicitis to  recur.  Discussed with her further the option for laparoscopic appendectomy and discussed also the risks of surgery, post-op recovery, hospital stay, activity restrictions.  She has opted for surgical management. --She's been added on for the OR today pending availability.  She will remain NPO, with IV Zosyn, IV fluids, and appropriate pain control.   --All questions answered.  Face-to-face time spent with the patient and care providers was 70 minutes, with more than 50% of the time spent counseling, educating, and coordinating care of the patient.     Howie Ill, MD [facility]g:  [phone]

## 2021-06-01 NOTE — Anesthesia Postprocedure Evaluation (Signed)
Anesthesia Post Note  Patient: Debbie Tanner  Procedure(s) Performed: APPENDECTOMY LAPAROSCOPIC  Patient location during evaluation: PACU Anesthesia Type: General Level of consciousness: awake and alert Pain management: pain level controlled Vital Signs Assessment: post-procedure vital signs reviewed and stable Respiratory status: spontaneous breathing, nonlabored ventilation, respiratory function stable and patient connected to nasal cannula oxygen Cardiovascular status: blood pressure returned to baseline and stable Postop Assessment: no apparent nausea or vomiting Anesthetic complications: no   No notable events documented.   Last Vitals:  Vitals:   06/01/21 1245 06/01/21 1300  BP: 114/75 111/72  Pulse: 68 61  Resp: 14 16  Temp:    SpO2: 96% 94%    Last Pain:  Vitals:   06/01/21 1300  TempSrc:   PainSc: 1                  Corinda Gubler

## 2021-06-02 ENCOUNTER — Encounter: Payer: Self-pay | Admitting: Surgery

## 2021-06-02 LAB — BASIC METABOLIC PANEL
Anion gap: 8 (ref 5–15)
BUN: 8 mg/dL (ref 6–20)
CO2: 27 mmol/L (ref 22–32)
Calcium: 8.9 mg/dL (ref 8.9–10.3)
Chloride: 103 mmol/L (ref 98–111)
Creatinine, Ser: 0.85 mg/dL (ref 0.44–1.00)
GFR, Estimated: >60 mL/min (ref >60)
Glucose, Bld: 113 mg/dL — ABNORMAL HIGH (ref 70–99)
Potassium: 4.1 mmol/L (ref 3.5–5.1)
Sodium: 138 mmol/L (ref 135–145)

## 2021-06-02 LAB — CBC
HCT: 35.2 % — ABNORMAL LOW (ref 36.0–46.0)
Hemoglobin: 11.6 g/dL — ABNORMAL LOW (ref 12.0–15.0)
MCH: 27.7 pg (ref 26.0–34.0)
MCHC: 33 g/dL (ref 30.0–36.0)
MCV: 84 fL (ref 80.0–100.0)
Platelets: 324 10*3/uL (ref 150–400)
RBC: 4.19 MIL/uL (ref 3.87–5.11)
RDW: 12.4 % (ref 11.5–15.5)
WBC: 14.2 10*3/uL — ABNORMAL HIGH (ref 4.0–10.5)
nRBC: 0 % (ref 0.0–0.2)

## 2021-06-02 LAB — HIV ANTIBODY (ROUTINE TESTING W REFLEX): HIV Screen 4th Generation wRfx: NONREACTIVE

## 2021-06-02 LAB — MAGNESIUM: Magnesium: 2.2 mg/dL (ref 1.7–2.4)

## 2021-06-02 MED ORDER — IBUPROFEN 600 MG PO TABS: 600.0000 mg | ORAL_TABLET | Freq: Three times a day (TID) | ORAL | 1 refills | Status: AC | PRN

## 2021-06-02 MED ORDER — OXYCODONE HCL 5 MG PO TABS: 5.0000 mg | ORAL_TABLET | ORAL | 0 refills | Status: DC | PRN

## 2021-06-02 MED ORDER — AMOXICILLIN-POT CLAVULANATE 875-125 MG PO TABS: 1.0000 | ORAL_TABLET | Freq: Two times a day (BID) | ORAL | 0 refills | Status: AC

## 2021-06-02 MED ORDER — ACETAMINOPHEN 500 MG PO TABS: 1000.0000 mg | ORAL_TABLET | Freq: Four times a day (QID) | ORAL | Status: AC | PRN

## 2021-06-02 NOTE — Progress Notes (Signed)
Pt given d/c handout, reviewed with pt by nurse. Pt verbalized understanding. Escorted out in a wheelchair by axiliary.     °

## 2021-06-02 NOTE — Discharge Summary (Signed)
Patient ID: Debbie Tanner MRN: [id number] DOB/AGE: 2001-04-03 20 y.o.  Admit date: 06/01/2021 Discharge date: 06/02/2021   Discharge Diagnoses:  Active Problems:   Acute appendicitis   Procedures:  Laparoscopic appendectomy  Hospital Course: Patient was admitted on 06/01/21 with acute appendicitis and taken to the OR same day for laparoscopic appendectomy.  Appendix was inflamed distally but without evidence of perforation.  Post-operatively, she has been doing well.  Her pain is well controlled, she's tolerating a diet, ambulating.  On exam, she's in no acute distress, with stable vital signs.  Abdomen is soft, non-distended, appropriately tender to palpation. Incisions are clean, dry, intact.  She's ready for discharge home.  Will send with course of Augmentin and pain medications prn.  Follow up in 2 weeks.  Consults: None  Disposition: Discharge disposition: 01-Home or Self Care      Discharge Instructions     Call MD for:  difficulty breathing, headache or visual disturbances   Complete by: As directed    Call MD for:  persistant nausea and vomiting   Complete by: As directed    Call MD for:  redness, tenderness, or signs of infection (pain, swelling, redness, odor or green/yellow discharge around incision site)   Complete by: As directed    Call MD for:  severe uncontrolled pain   Complete by: As directed    Call MD for:  temperature >100.4   Complete by: As directed    Diet - low sodium heart healthy   Complete by: As directed    Discharge instructions   Complete by: As directed    1.  Patient may shower, but do not scrub wounds heavily and dab dry only. 2.  Do not submerge wounds in pool/tub for 1 week. 3.  Do not apply ointments or hydrogen peroxide to the wounds. 4.  May apply ice packs to the wounds for comfort.   Driving Restrictions   Complete by: As directed    Do not drive while taking narcotics for pain control.   Increase activity slowly   Complete  by: As directed    Lifting restrictions   Complete by: As directed    No heavy lifting or pushing of more than 10-15 lbs for 4 weeks.   No dressing needed   Complete by: As directed       Allergies as of 06/02/2021   No Known Allergies      Medication List     STOP taking these medications    cephALEXin 500 MG capsule Commonly known as: KEFLEX       TAKE these medications    acetaminophen 500 MG tablet Commonly known as: TYLENOL Take 2 tablets (1,000 mg total) by mouth every 6 (six) hours as needed for mild pain.   amoxicillin-clavulanate 875-125 MG tablet Commonly known as: Augmentin Take 1 tablet by mouth 2 (two) times daily for 7 days.   ibuprofen 600 MG tablet Commonly known as: ADVIL Take 1 tablet (600 mg total) by mouth every 8 (eight) hours as needed for moderate pain.   oxyCODONE 5 MG immediate release tablet Commonly known as: Oxy IR/ROXICODONE Take 1 tablet (5 mg total) by mouth every 4 (four) hours as needed for severe pain.               Discharge Care Instructions  (From admission, onward)           Start     Ordered   06/02/21 0000  No dressing needed  06/02/21 0945            Follow-up Information     Donovan Kail, PA-C Follow up in 2 week(s).   Specialty: Physician Assistant Contact information: [address] [place] [address] [phone]

## 2021-06-03 ENCOUNTER — Telehealth: Payer: Self-pay

## 2021-06-03 ENCOUNTER — Telehealth: Payer: Self-pay | Admitting: Surgery

## 2021-06-03 NOTE — Telephone Encounter (Signed)
Outbound call made to the pt; voicemail left requesting a call back to schedule her 2 wk post op appt w/Zach, PA.  If she does not return the call by end of the day, another attempt will be made tomorrow.

## 2021-06-03 NOTE — Telephone Encounter (Signed)
Laparoscopic Appendectomy 06/01/21 Dr.Piscoya. Needs post operative appointment made.

## 2021-06-03 NOTE — Telephone Encounter (Signed)
LVM for pt to call the office to schedule a 2 week post op appt with Zach. Had lap appy on 06/01/2021.

## 2021-06-05 LAB — SURGICAL PATHOLOGY

## 2021-06-17 ENCOUNTER — Other Ambulatory Visit: Payer: Self-pay

## 2021-06-17 ENCOUNTER — Encounter: Payer: Self-pay | Admitting: Physician Assistant

## 2021-06-17 ENCOUNTER — Ambulatory Visit (INDEPENDENT_AMBULATORY_CARE_PROVIDER_SITE_OTHER): Payer: BC Managed Care – PPO | Admitting: Physician Assistant

## 2021-06-17 VITALS — BP 129/84 | HR 92 | Temp 98.0°F | Ht 63.0 in | Wt 146.0 lb

## 2021-06-17 DIAGNOSIS — Z09 Encounter for follow-up examination after completed treatment for conditions other than malignant neoplasm: Secondary | ICD-10-CM

## 2021-06-17 DIAGNOSIS — K353 Acute appendicitis with localized peritonitis, without perforation or gangrene: Secondary | ICD-10-CM

## 2021-06-17 NOTE — Patient Instructions (Signed)
Follow-up with our office as needed.  Please call and ask to speak with a nurse if you develop questions or concerns.   GENERAL POST-OPERATIVE PATIENT INSTRUCTIONS   WOUND CARE INSTRUCTIONS:  If the wound becomes bright red and painful or starts to drain infected material that is not clear, please contact your physician immediately.  If the wound is mildly pink and has a thick firm ridge underneath it, this is normal, and is referred to as a healing ridge.  This will resolve over the next 4-6 weeks.  BATHING: You may shower if you have been informed of this by your surgeon. However, Please do not submerge in a tub, hot tub, or pool until incisions are completely sealed or have been told by your surgeon that you may do so.  DIET:  You may eat any foods that you can tolerate.  It is a good idea to eat a high fiber diet and take in plenty of fluids to prevent constipation.  If you do become constipated you may want to take a mild laxative or take ducolax tablets on a daily basis until your bowel habits are regular.  Constipation can be very uncomfortable, along with straining, after recent surgery.  ACTIVITY: You are encouraged to walk and engage in light activity for the next two weeks.  You should not lift more than 20 pounds for 4 weeks after surgery as it could put you at increased risk for complications. Twenty pounds is roughly equivalent to a plastic bag of groceries. At that time- Listen to your body when lifting, if you have pain when lifting, stop and then try again in a few days. Soreness after doing exercises or activities of daily living is normal as you get back in to your normal routine.  MEDICATIONS:  Try to take narcotic medications and anti-inflammatory medications, such as tylenol, ibuprofen, naprosyn, etc., with food.  This will minimize stomach upset from the medication.  Should you develop nausea and vomiting from the pain medication, or develop a rash, please discontinue the  medication and contact your physician.  You should not drive, make important decisions, or operate machinery when taking narcotic pain medication.  SUNBLOCK Use sun block to incision area over the next year if this area will be exposed to sun. This helps decrease scarring and will allow you avoid a permanent darkened area over your incision.  QUESTIONS:  Please feel free to call our office if you have any questions, and we will be glad to assist you.

## 2021-06-17 NOTE — Progress Notes (Signed)
[facility] POST-OP OFFICE VISIT  06/17/2021  HPI: Debbie Tanner is a 20 y.o. female 16 days s/p laparoscopic appendectomy for acute appendicitis with Dr Aleen Campi.   She has done well since surgery No abdominal pain, fever, chills, nausea, emesis, or bowel changes No issues with incisions Tolerating PO without issue No other complaints  Vital signs: BP 129/84   Pulse 92   Temp 98 F (36.7 C)   Ht 5\' 3"  (1.6 m)   Wt 146 lb (66.2 kg)   LMP 05/23/2021 (Approximate)   SpO2 98%   BMI 25.86 kg/m    Physical Exam: Constitutional: Well appearing female, NAD Abdomen: Soft, non-tender, non-distended, no rebound/guarding Skin: Laparoscopic incisions are well healed, no erythema or drainage   Assessment/Plan: This is a 20 y.o. female 16 days s/p laparoscopic appendectomy for acute appendicitis with Dr 15.    - Pain control prn  - Reviewed wound care  - Reviewed lifting restrictions; 4 weeks total  - Reviewed pathology: Periappendicitis  - She can rtc on as needed basis   -- Aleen Campi, PA-C [facility] 06/17/2021, 10:32 AM [phone] M-F: 7am - 4pm

## 2022-11-02 IMAGING — US US PELVIS COMPLETE
1 series · 14 of 25 positions shown · non-contrast
Comparison: None.

CLINICAL DATA: Right-sided pelvic pain.

EXAM:
TRANSABDOMINAL ULTRASOUND OF PELVIS
DOPPLER ULTRASOUND OF OVARIES
TECHNIQUE: Transabdominal ultrasound examination of the pelvis was performed
including evaluation of the uterus, ovaries, adnexal regions, and
pelvic cul-de-sac.
Color and duplex Doppler ultrasound was utilized to evaluate blood
flow to the ovaries.

[Series 1: gyn us · 14 of 95 slices shown]
[im 1/95]
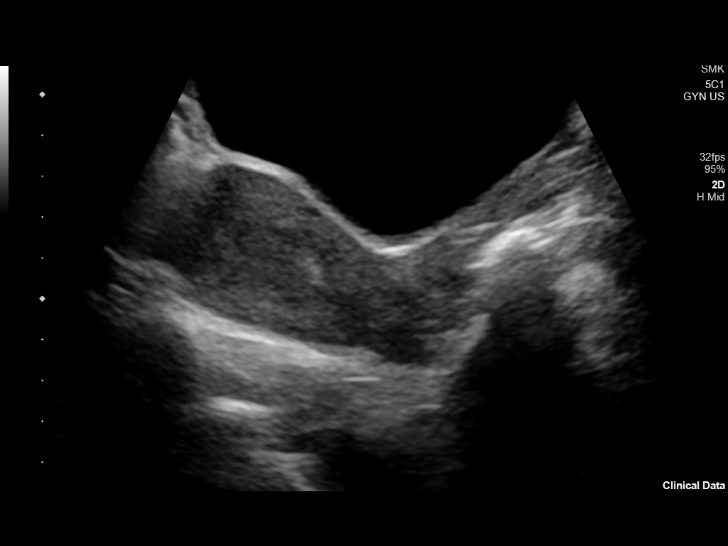
[im 8/95]
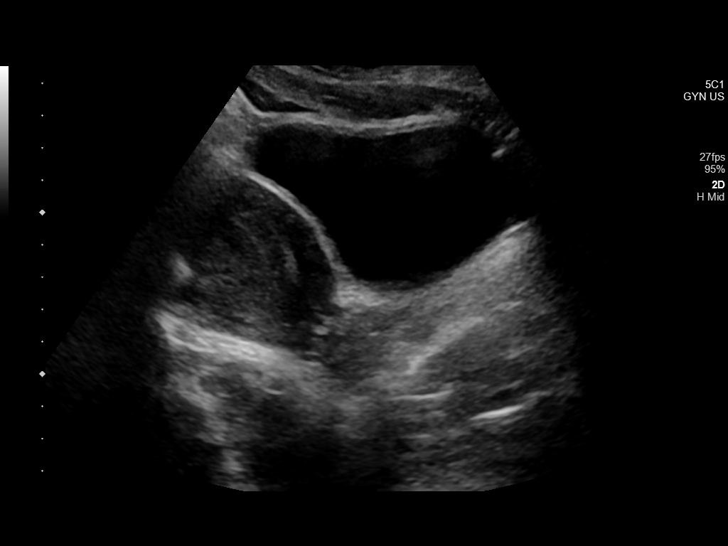
[im 16/95]
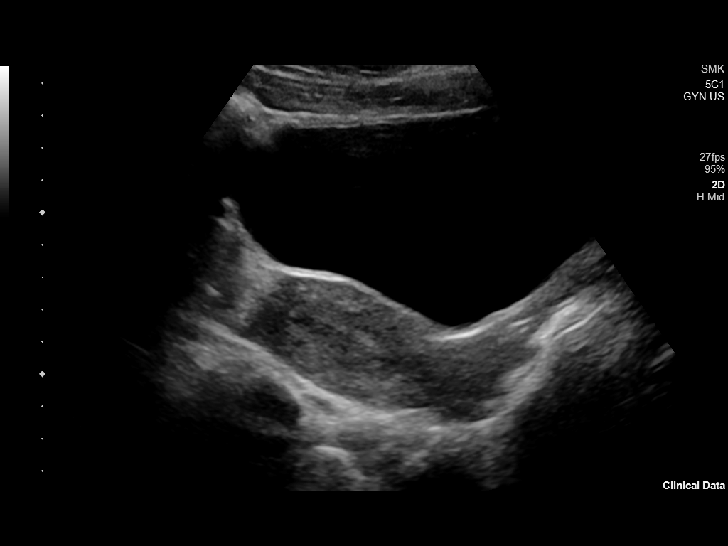
[im 24/95]
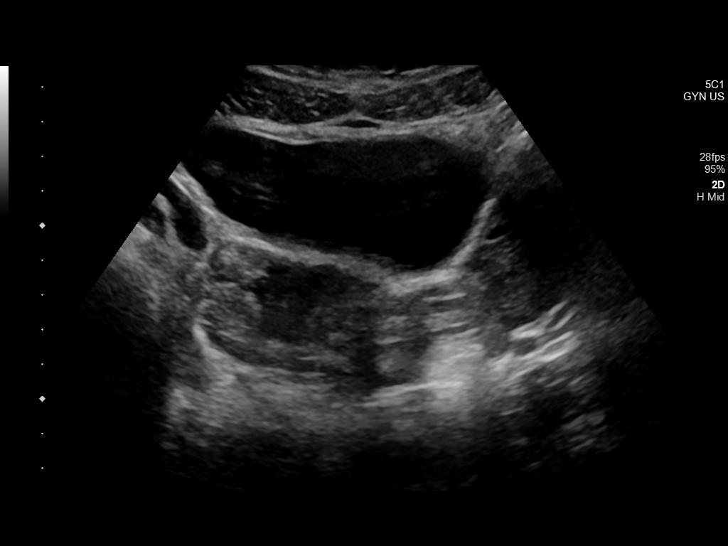
[im 32/95]
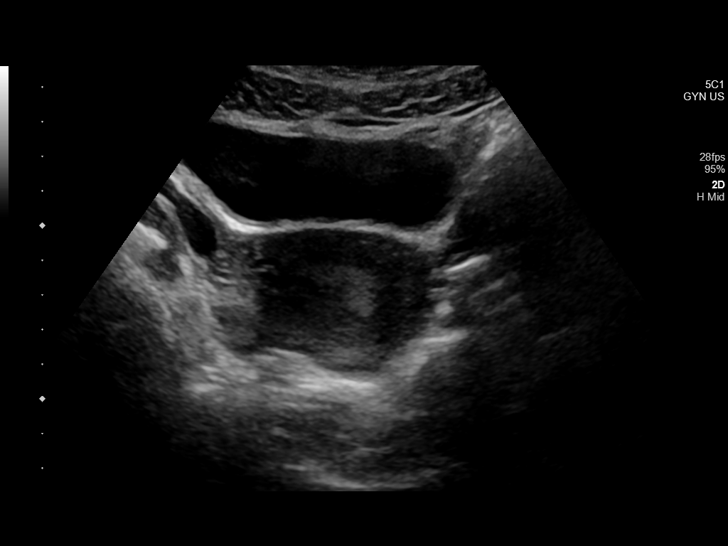
[im 36/95]
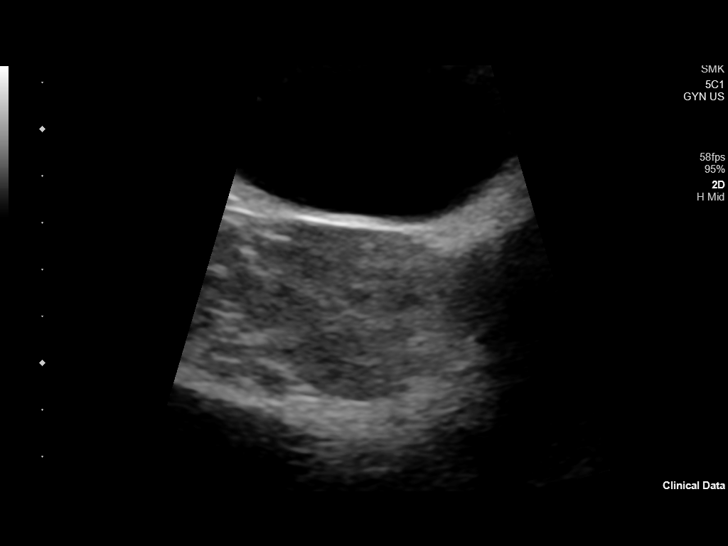
[im 44/95]
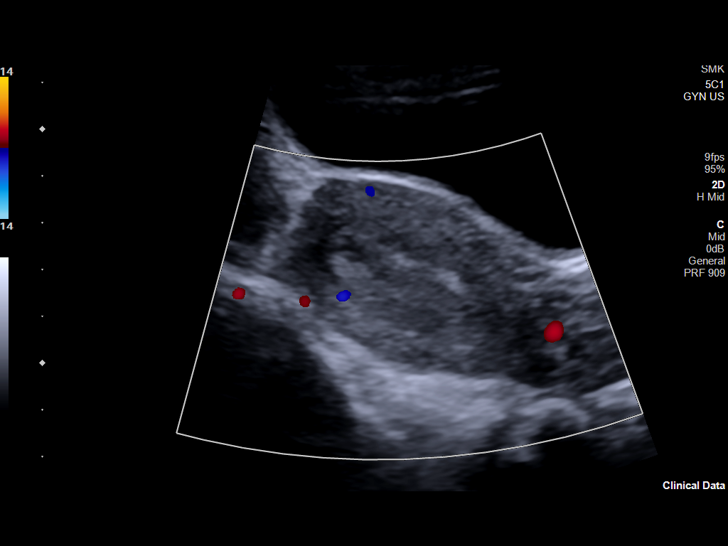
[im 51/95]
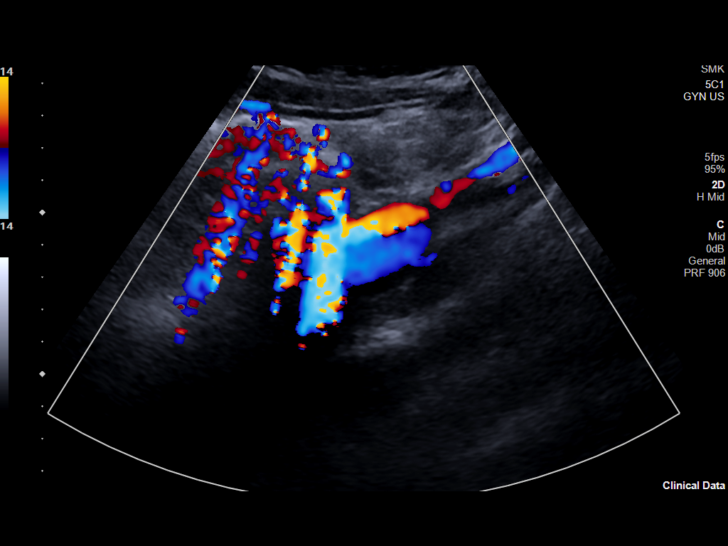
[im 59/95]
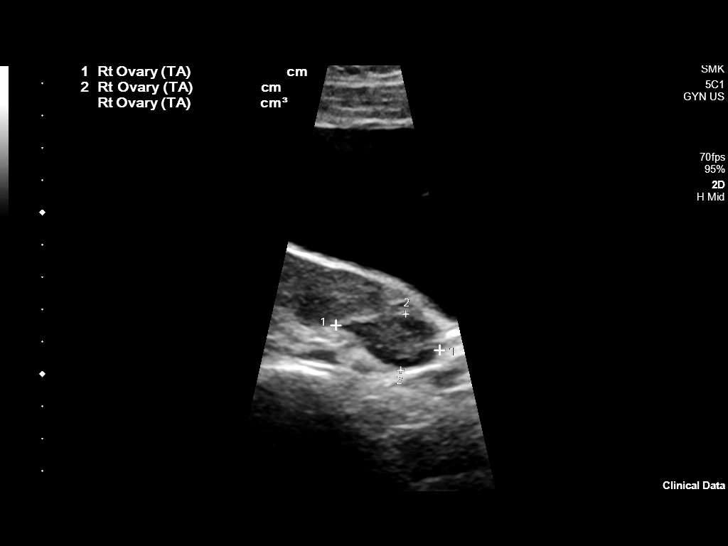
[im 63/95]
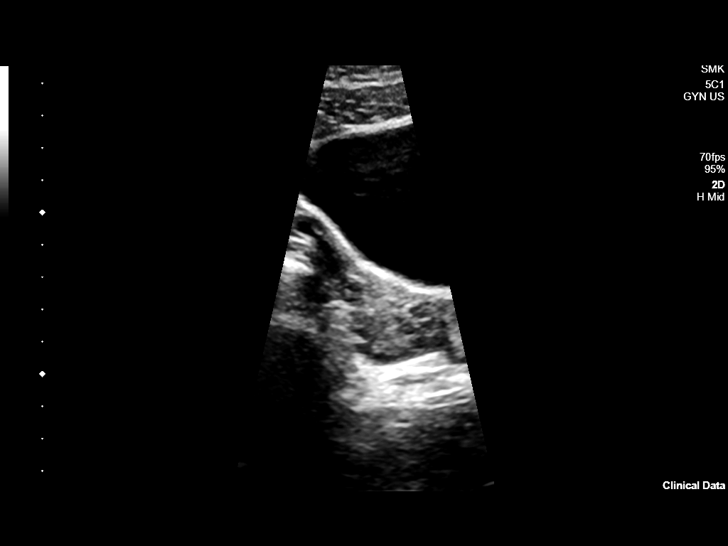
[im 71/95]
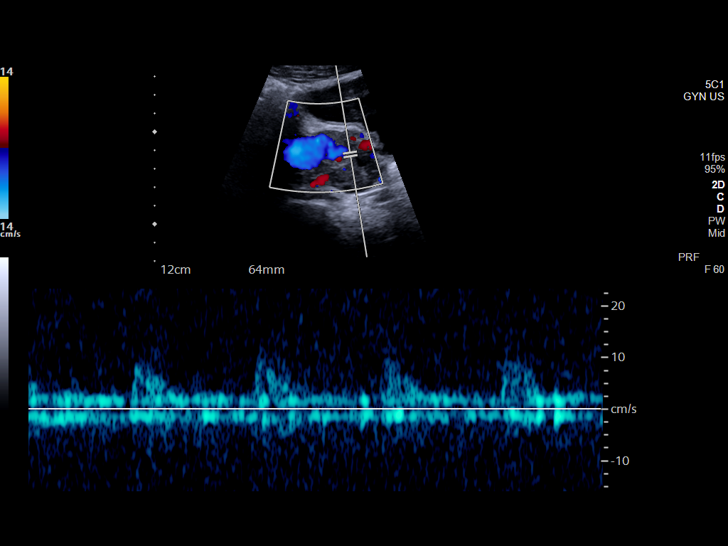
[im 79/95]
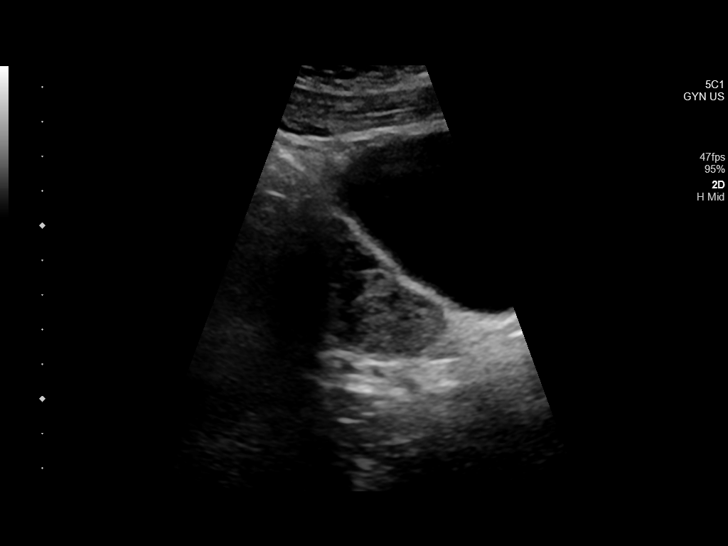
[im 87/95]
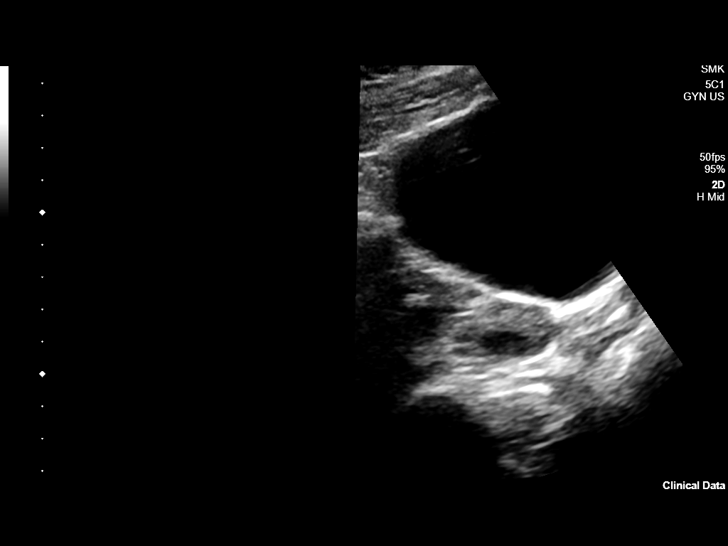
[im 95/95]
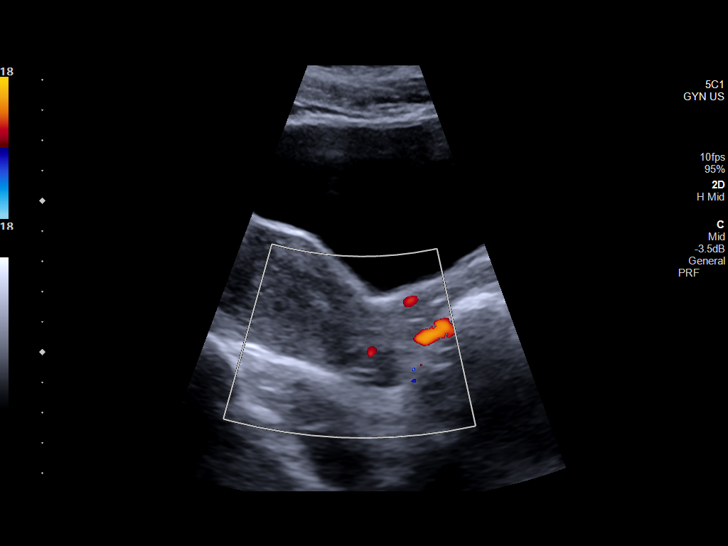

[14 of 25 positions shown; findings below may reference images not displayed]

FINDINGS: Uterus

Measurements: 7.4 x 3.7 x 5.1 cm = volume: 77 mL. No fibroids or
other mass visualized.

Endometrium

Thickness: 8 mm.  No focal abnormality visualized.

Right ovary

Measurements: 3.3 x 1.8 x 2.6 cm = volume: 7.8 mL. Normal
appearance/no adnexal mass.

Left ovary

Measurements: 3.9 x 1.7 x 2.8 cm = volume: 10.1 mL. Normal
appearance/no adnexal mass.

Pulsed Doppler evaluation demonstrates normal low-resistance
arterial and venous waveforms in both ovaries.

Other: None.
IMPRESSION: 1. Normal pelvic ultrasound.

## 2022-11-28 ENCOUNTER — Emergency Department (HOSPITAL_COMMUNITY)
Admission: EM | Admit: 2022-11-28 | Discharge: 2022-11-28 | Disposition: A | Discharge status: Home / Self Care | Payer: BC Managed Care – PPO | Attending: Emergency Medicine | Admitting: Emergency Medicine

## 2022-11-28 ENCOUNTER — Emergency Department (HOSPITAL_COMMUNITY): Payer: BC Managed Care – PPO

## 2022-11-28 DIAGNOSIS — Z5329 Procedure and treatment not carried out because of patient's decision for other reasons: Secondary | ICD-10-CM | POA: Insufficient documentation

## 2022-11-28 DIAGNOSIS — N939 Abnormal uterine and vaginal bleeding, unspecified: Secondary | ICD-10-CM

## 2022-11-28 DIAGNOSIS — R102 Pelvic and perineal pain: Secondary | ICD-10-CM | POA: Diagnosis present

## 2022-11-28 LAB — URINALYSIS, ROUTINE W REFLEX MICROSCOPIC
Bilirubin Urine: NEGATIVE
Glucose, UA: NEGATIVE mg/dL
Ketones, ur: NEGATIVE mg/dL
Leukocytes,Ua: NEGATIVE
Nitrite: NEGATIVE
Protein, ur: 30 mg/dL — AB
RBC / HPF: >50 RBC/hpf (ref 0–5)
Specific Gravity, Urine: 1.032 — ABNORMAL HIGH (ref 1.005–1.030)
pH: 5 (ref 5.0–8.0)

## 2022-11-28 LAB — COMPREHENSIVE METABOLIC PANEL
ALT: 10 U/L (ref 0–44)
AST: 14 U/L — ABNORMAL LOW (ref 15–41)
Albumin: 4 g/dL (ref 3.5–5.0)
Alkaline Phosphatase: 33 U/L — ABNORMAL LOW (ref 38–126)
Anion gap: 5 (ref 5–15)
BUN: 9 mg/dL (ref 6–20)
CO2: 24 mmol/L (ref 22–32)
Calcium: 8.8 mg/dL — ABNORMAL LOW (ref 8.9–10.3)
Chloride: 107 mmol/L (ref 98–111)
Creatinine, Ser: 1.1 mg/dL — ABNORMAL HIGH (ref 0.44–1.00)
GFR, Estimated: >60 mL/min (ref >60)
Glucose, Bld: 92 mg/dL (ref 70–99)
Potassium: 3.9 mmol/L (ref 3.5–5.1)
Sodium: 136 mmol/L (ref 135–145)
Total Bilirubin: 1 mg/dL (ref 0.3–1.2)
Total Protein: 7.2 g/dL (ref 6.5–8.1)

## 2022-11-28 LAB — CBC WITH DIFFERENTIAL/PLATELET
Abs Immature Granulocytes: 0.01 10*3/uL (ref 0.00–0.07)
Basophils Absolute: 0.1 10*3/uL (ref 0.0–0.1)
Basophils Relative: 1 %
Eosinophils Absolute: 0.3 10*3/uL (ref 0.0–0.5)
Eosinophils Relative: 6 %
HCT: 42.7 % (ref 36.0–46.0)
Hemoglobin: 13.6 g/dL (ref 12.0–15.0)
Immature Granulocytes: 0 %
Lymphocytes Relative: 36 %
Lymphs Abs: 2.1 10*3/uL (ref 0.7–4.0)
MCH: 27.5 pg (ref 26.0–34.0)
MCHC: 31.9 g/dL (ref 30.0–36.0)
MCV: 86.4 fL (ref 80.0–100.0)
Monocytes Absolute: 0.4 10*3/uL (ref 0.1–1.0)
Monocytes Relative: 7 %
Neutro Abs: 2.9 10*3/uL (ref 1.7–7.7)
Neutrophils Relative %: 50 %
Platelets: 253 10*3/uL (ref 150–400)
RBC: 4.94 MIL/uL (ref 3.87–5.11)
RDW: 12.9 % (ref 11.5–15.5)
WBC: 5.9 10*3/uL (ref 4.0–10.5)
nRBC: 0 % (ref 0.0–0.2)

## 2022-11-28 LAB — I-STAT BETA HCG BLOOD, ED (MC, WL, AP ONLY): I-stat hCG, quantitative: <5 m[IU]/mL (ref <5)

## 2022-11-28 NOTE — ED Triage Notes (Signed)
Pt presents to ED from home C/O vaginal bleeding X 3 days.LMP 11/13/22. Reports changing super tampon every 45 minutes.

## 2022-11-28 NOTE — ED Provider Triage Note (Signed)
Emergency Medicine Provider Triage Evaluation Note  SALIMATOU KAMBER , a 22 y.o. female  was evaluated in triage.  Pt complains of vaginal bleeding. States she woke up 3 days ago and used the restroom and wiped with blood. States that since then it has been "non stop bleeding." States she is having throbbing pain in her pelvis and bleeding is significantly increased from a normal menstrual cycle. Using tampons, has used 2 this morning. Not passing clots. Using oral birth control.  LMP 2/8. Denies dysuria, n/v/d, fever. Sexually active with 1 female partner.  Review of Systems  Positive: See above Negative:   Physical Exam  BP (!) 154/102 (BP Location: Left Arm)   Pulse 91   Temp 98.4 F (36.9 C) (Oral)   Resp 18   Ht '5\' 3"'$  (1.6 m)   Wt 65.8 kg   SpO2 98%   BMI 25.69 kg/m  Gen:   Awake, no distress   Resp:  Normal effort  MSK:   Moves extremities without difficulty  Other:  Mild pelvic abdominal TTP L>R  Medical Decision Making  Medically screening exam initiated at 3:14 PM.  Appropriate orders placed.  Georgianne Fick was informed that the remainder of the evaluation will be completed by another provider, this initial triage assessment does not replace that evaluation, and the importance of remaining in the ED until their evaluation is complete.     Mickie Hillier, PA-C 11/28/22 1517

## 2022-11-28 NOTE — ED Provider Notes (Signed)
[place] [facility] Provider Note   CSN: [id number] Arrival date & time: 11/28/22  1456     History  Chief Complaint  Patient presents with   Vaginal Bleeding    Debbie Tanner is a 22 y.o. female.  With medical history of appendectomy who presents to the emergency department with vaginal bleeding.  Patient states that vaginal bleeding began about 3 days ago.  She states that she woke up to use the restroom and wiped blood.  She states that since then she has had "nonstop bleeding."  She is also endorsing having a throbbing pain in her pelvis.  She states that the bleeding is increased from a normal menstrual cycle.  She is using tampons and states that she has used 2 tampons this morning.  She denies passing clots.  Denies dysuria, nausea, vomiting, diarrhea, fevers.  Last menstrual period was 11/13/2022.  She is using oral contraceptives.  Sexually active with 1 female partner.  She does not have concern over STD at this time.   Vaginal Bleeding      Home Medications Prior to Admission medications   Medication Sig Start Date End Date Taking? Authorizing Provider  acetaminophen (TYLENOL) 500 MG tablet Take 2 tablets (1,000 mg total) by mouth every 6 (six) hours as needed for mild pain. 06/02/21   Olean Ree, MD  ibuprofen (ADVIL) 600 MG tablet Take 1 tablet (600 mg total) by mouth every 8 (eight) hours as needed for moderate pain. 06/02/21   Olean Ree, MD  Mefenamic Acid 250 MG CAPS Take by mouth. 03/27/21   [provider]      Allergies    Patient has no known allergies.    Review of Systems   Review of Systems  Genitourinary:  Positive for menstrual problem, pelvic pain and vaginal bleeding.  All other systems reviewed and are negative.   Physical Exam Updated Vital Signs BP 136/88 (BP Location: Left Arm)   Pulse 64   Temp 98.4 F (36.9 C) (Oral)   Resp 16   Ht '5\' 3"'$  (1.6 m)   Wt 65.8 kg   SpO2 100%   BMI 25.69  kg/m  Physical Exam Vitals and nursing note reviewed. Exam conducted with a chaperone present.  Constitutional:      General: She is not in acute distress.    Appearance: Normal appearance. She is not ill-appearing or toxic-appearing.  HENT:     Head: Normocephalic.  Eyes:     General: No scleral icterus.    Extraocular Movements: Extraocular movements intact.  Cardiovascular:     Rate and Rhythm: Normal rate and regular rhythm.     Pulses: Normal pulses.     Heart sounds: No murmur heard. Pulmonary:     Effort: Pulmonary effort is normal. No respiratory distress.     Breath sounds: Normal breath sounds.  Abdominal:     General: Abdomen is flat. Bowel sounds are normal. There is no distension.     Palpations: Abdomen is soft.     Tenderness: There is abdominal tenderness in the left lower quadrant. There is no guarding or rebound.  Genitourinary:    General: Normal vulva.     Exam position: Lithotomy position.     Vagina: Bleeding present.     Cervix: Cervical bleeding present.  Musculoskeletal:        General: Normal range of motion.  Skin:    General: Skin is warm and dry.  Capillary Refill: Capillary refill takes less than 2 seconds.  Neurological:     General: No focal deficit present.     Mental Status: She is alert and oriented to person, place, and time. Mental status is at baseline.  Psychiatric:        Mood and Affect: Mood normal.        Behavior: Behavior normal.     ED Results / Procedures / Treatments   Labs (all labs ordered are listed, but only abnormal results are displayed) Labs Reviewed  COMPREHENSIVE METABOLIC PANEL - Abnormal; Notable for the following components:      Result Value   Creatinine, Ser 1.10 (*)    Calcium 8.8 (*)    AST 14 (*)    Alkaline Phosphatase 33 (*)    All other components within normal limits  URINALYSIS, ROUTINE W REFLEX MICROSCOPIC - Abnormal; Notable for the following components:   Color, Urine AMBER (*)     APPearance HAZY (*)    Specific Gravity, Urine 1.032 (*)    Hgb urine dipstick LARGE (*)    Protein, ur 30 (*)    Bacteria, UA RARE (*)    All other components within normal limits  WET PREP, GENITAL  CBC WITH DIFFERENTIAL/PLATELET  I-STAT BETA HCG BLOOD, ED ([facility], [facility], [facility] ONLY)    EKG None  Radiology US Pelvis Complete  Result Date: 11/28/2022 CLINICAL DATA:  Vaginal pain and bleeding for 3 days. LMP was 11/13/2022. Patient refused transvaginal imaging. EXAM: TRANSABDOMINAL ULTRASOUND OF PELVIS DOPPLER ULTRASOUND OF OVARIES TECHNIQUE: Transabdominal ultrasound examination of the pelvis was performed including evaluation of the uterus, ovaries, adnexal regions, and pelvic cul-de-sac. Color and duplex Doppler ultrasound was utilized to evaluate blood flow to the ovaries. COMPARISON:  CT 06/01/2021.  Ultrasound 05/20/2021 FINDINGS: Uterus Measurements: 7.1 x 3.5 x 5.1 cm = volume: 66 mL. Uterus is anteverted. No fibroids or other mass visualized. Endometrium Thickness: 4 mm.  No focal abnormality visualized. Right ovary Measurements: 3.3 x 1.9 x 2.3 cm = volume: 8 mL. Normal appearance/no adnexal mass. Left ovary Measurements: 2 x 1.3 x 1.5 cm = volume: 2 mL. Somewhat limited visualization due to overlying bowel gas but no abnormal adnexal masses identified. Pulsed Doppler evaluation demonstrates normal low-resistance arterial and venous waveforms in both ovaries. Other: No free fluid. IMPRESSION: Normal ultrasound appearance of the uterus and ovaries. No evidence of ovarian mass or torsion. Electronically Signed   By: Lucienne Capers M.D.   On: 11/28/2022 18:48   [place] Art/Ven Flow Abd Pelv Doppler  Result Date: 11/28/2022 CLINICAL DATA:  Vaginal pain and bleeding for 3 days. LMP was 11/13/2022. Patient refused transvaginal imaging. EXAM: TRANSABDOMINAL ULTRASOUND OF PELVIS DOPPLER ULTRASOUND OF OVARIES TECHNIQUE: Transabdominal ultrasound examination of the pelvis was performed including evaluation  of the uterus, ovaries, adnexal regions, and pelvic cul-de-sac. Color and duplex Doppler ultrasound was utilized to evaluate blood flow to the ovaries. COMPARISON:  CT 06/01/2021.  Ultrasound 05/20/2021 FINDINGS: Uterus Measurements: 7.1 x 3.5 x 5.1 cm = volume: 66 mL. Uterus is anteverted. No fibroids or other mass visualized. Endometrium Thickness: 4 mm.  No focal abnormality visualized. Right ovary Measurements: 3.3 x 1.9 x 2.3 cm = volume: 8 mL. Normal appearance/no adnexal mass. Left ovary Measurements: 2 x 1.3 x 1.5 cm = volume: 2 mL. Somewhat limited visualization due to overlying bowel gas but no abnormal adnexal masses identified. Pulsed Doppler evaluation demonstrates normal low-resistance arterial and venous waveforms in both ovaries. Other: No free  fluid. IMPRESSION: Normal ultrasound appearance of the uterus and ovaries. No evidence of ovarian mass or torsion. Electronically Signed   By: Lucienne Capers M.D.   On: 11/28/2022 18:48    Procedures Procedures   Medications Ordered in ED Medications - No data to display  ED Course/ Medical Decision Making/ A&P    Medical Decision Making Amount and/or Complexity of Data Reviewed Labs: ordered. Radiology: ordered.  Initial Impression and Ddx 22 year old female who presents to the emergency department with pelvic pain, vaginal bleeding over the past 3 days.  She is overall well-appearing, nonseptic, nontoxic in appearance.  She is hemodynamically stable.  Does not appear to be actively hemorrhaging at this time.  Will obtain labs, likely ultrasound, pelvic exam. Patient PMH that increases complexity of ED encounter: None Differential: Menstrual cycle, abnormal uterine bleeding, miscarriage, etc.  Interpretation of Diagnostics I independent reviewed and interpreted the labs as followed: CMP with no severe derangement, CBC with hemoglobin of 13.6, platelets 253.  UA with hemoglobin.  Not pregnant  - I independently visualized the  following imaging with scope of interpretation limited to determining acute life threatening conditions related to emergency care: Ultrasound, which revealed no acute findings  Patient Reassessment and Ultimate Disposition/Management 21 year old female who presents with vaginal bleeding. Well appearing, hemodynamically stable.   Labs without acute anemia.  She is not pregnant so doubt ectopic or spontaneous abortion  Obtained an ultrasound which was normal and did not demonstrate ectopic, torsion, fibroids or other abnormalities Pelvic exam with bleeding from the cervix. Os is not dilated. No CMT. No trauma.   She declined STD testing. Unfortunately we sent down a wet prep which did not result prior to patient eloping from the emergency department. I have little suspicion for BV, yeast, trich.   This may just be an early period? She is not significantly bleeding from the cervix. There is no hemorrhage. She is using 3-4 pads a day. May have an an anovulatory cycle. Would like her to have watchful waiting. We discussed her having follow-up with obgyn as she should have papsmear and routine follow-up given that she is sexually active. Will also have her f/u with PCP for routine care. She is instructed to return for significant increase in bleeding.   The patient has been appropriately medically screened and/or stabilized in the ED. I have low suspicion for any other emergent medical condition which would require further screening, evaluation or treatment in the ED or require inpatient management. At time of discharge the patient is hemodynamically stable and in no acute distress. I have discussed work-up results and diagnosis with patient and answered all questions. Patient is agreeable with discharge plan. We discussed strict return precautions for returning to the emergency department and they verbalized understanding.     Patient management required discussion with the following services or  consulting groups:  None  Complexity of Problems Addressed Acute complicated illness or Injury  Additional Data Reviewed and Analyzed Further history obtained from: Past medical history and medications listed in the EMR, Prior ED visit notes, and Care Everywhere  Patient Encounter Risk Assessment SDOH impact on management  Final Clinical Impression(s) / ED Diagnoses Final diagnoses:  Vaginal bleeding    Rx / DC Orders ED Discharge Orders     None         Mickie Hillier, PA-C 11/28/22 [place], DO 11/28/22 2306

## 2022-11-28 NOTE — Discharge Instructions (Addendum)
You were seen in the emergency department today for vaginal bleeding. Your blood count and ultrasound are normal. You are not pregnant. Please follow-up and establish care with an OB-GYN so you can have routine care including pap smears now that you are sexually active. Additionally, please return if you are soaking through a pad per hour or begin feeling short of breath or like you are going to pass out while having significant bleeding.

## 2022-12-01 LAB — GC/CHLAMYDIA PROBE AMP (~~LOC~~) NOT AT ARMC
Chlamydia: POSITIVE — AB
Comment: NEGATIVE
Comment: NORMAL
Neisseria Gonorrhea: NEGATIVE

## 2022-12-03 ENCOUNTER — Telehealth (HOSPITAL_COMMUNITY): Payer: Self-pay

## 2022-12-03 MED ORDER — DOXYCYCLINE HYCLATE 100 MG PO CAPS: 100.0000 mg | ORAL_CAPSULE | Freq: Two times a day (BID) | ORAL | 0 refills | Status: AC

## 2023-09-04 ENCOUNTER — Encounter (HOSPITAL_COMMUNITY)
Admit: 2023-09-04 | Discharge: 2023-09-04 | Disposition: A | Discharge status: Home / Self Care | Payer: BC Managed Care – PPO | Attending: Adult Health | Admitting: Adult Health
